# Patient Record
Sex: Female | Born: 1975
Health system: Southern US, Community
[De-identification: ages and names within clinical notes are randomized; demographics above are authoritative.]

## PROBLEM LIST (undated history)

## (undated) DIAGNOSIS — R519 Headache, unspecified: Secondary | ICD-10-CM

## (undated) DIAGNOSIS — D649 Anemia, unspecified: Secondary | ICD-10-CM

## (undated) DIAGNOSIS — R011 Cardiac murmur, unspecified: Secondary | ICD-10-CM

## (undated) DIAGNOSIS — R51 Headache: Secondary | ICD-10-CM

## (undated) HISTORY — PX: MYOMECTOMY: SHX85

## (undated) HISTORY — PX: TUMOR REMOVAL: SHX12

## (undated) HISTORY — PX: FRACTURE SURGERY: SHX138

## (undated) HISTORY — PX: THYROID LOBECTOMY: SHX420

---

## 2003-10-03 ENCOUNTER — Other Ambulatory Visit: Admission: RE | Admit: 2003-10-03 | Discharge: 2003-10-03 | Payer: Self-pay | Admitting: Obstetrics and Gynecology

## 2017-10-12 DIAGNOSIS — E041 Nontoxic single thyroid nodule: Secondary | ICD-10-CM | POA: Diagnosis not present

## 2017-10-12 DIAGNOSIS — R197 Diarrhea, unspecified: Secondary | ICD-10-CM | POA: Diagnosis not present

## 2017-10-12 DIAGNOSIS — R109 Unspecified abdominal pain: Secondary | ICD-10-CM | POA: Diagnosis not present

## 2017-10-19 ENCOUNTER — Encounter (HOSPITAL_COMMUNITY): Payer: Self-pay | Admitting: Family Medicine

## 2017-10-19 ENCOUNTER — Other Ambulatory Visit: Payer: Self-pay | Admitting: Internal Medicine

## 2017-10-19 ENCOUNTER — Emergency Department (HOSPITAL_COMMUNITY)
Admission: EM | Admit: 2017-10-19 | Discharge: 2017-10-19 | Disposition: A | Discharge status: Home / Self Care | Payer: BLUE CROSS/BLUE SHIELD | Attending: Emergency Medicine | Admitting: Emergency Medicine

## 2017-10-19 DIAGNOSIS — M79644 Pain in right finger(s): Secondary | ICD-10-CM | POA: Diagnosis not present

## 2017-10-19 DIAGNOSIS — R202 Paresthesia of skin: Secondary | ICD-10-CM | POA: Diagnosis not present

## 2017-10-19 DIAGNOSIS — E041 Nontoxic single thyroid nodule: Secondary | ICD-10-CM

## 2017-10-19 DIAGNOSIS — R200 Anesthesia of skin: Secondary | ICD-10-CM | POA: Insufficient documentation

## 2017-10-19 DIAGNOSIS — L723 Sebaceous cyst: Secondary | ICD-10-CM | POA: Diagnosis not present

## 2017-10-19 DIAGNOSIS — D367 Benign neoplasm of other specified sites: Secondary | ICD-10-CM | POA: Diagnosis not present

## 2017-10-19 DIAGNOSIS — M79631 Pain in right forearm: Secondary | ICD-10-CM | POA: Diagnosis not present

## 2017-10-19 NOTE — ED Triage Notes (Signed)
Patient reports on Friday morning she noticed a small tender area on her right forearm. As the weekend as progressed, it has increased in tenderness, harder, more defend area, and pain with numbness in the fingers. Patient denies taking any thing for the symptoms.

## 2017-10-19 NOTE — ED Provider Notes (Signed)
[facility] DEPT Provider Note   CSN: [id number] Arrival date & time: 10/19/17  1721     History   Chief Complaint Chief Complaint  Patient presents with  . Cyst    HPI Ann Olson is a 41 y.o. female.  HPI   41 year old female presenting for evaluation of discomfort in her right forearm.  Patient reports 4 days ago she woke up noticing an area of discomfort to right proximal forearm.  But it has since increasing in size and become more painful.  Pain is described as sharp achy sensation with tingling numbness radiates down to her arm and hand.  She denies any specific treatment tried.  She denies any specific injury.  No fever or chills.  No prior history of abscess.  She does have history of having benign tumors that appears in the hip, abdomen, and lower jaw requiring surgical excision in the past.  She also mention having prior blood clot in the past after surgery.    History reviewed. No pertinent past medical history.  There are no active problems to display for this patient.   Past Surgical History:  Procedure Laterality Date  . MYOMECTOMY    . TUMOR REMOVAL     Left Hip  . TUMOR REMOVAL     Face, Lower Jaw     OB History    No data available       Home Medications    Prior to Admission medications   Not on File    Family History History reviewed. No pertinent family history.  Social History Social History   Tobacco Use  . Smoking status: Never Smoker  . Smokeless tobacco: Never Used  Substance Use Topics  . Alcohol use: Yes    Comment: Last drink: September   . Drug use: No     Allergies   Patient has no allergy information on record.   Review of Systems Review of Systems  Constitutional: Negative for fever.  Respiratory: Negative for shortness of breath.   Cardiovascular: Negative for chest pain.  Skin: Negative for rash and wound.  Neurological: Positive for numbness.     Physical  Exam Updated Vital Signs BP (!) 126/92 (BP Location: Left Arm)   Pulse 72   Temp 98.3 F (36.8 C) (Oral)   Resp 18   Ht 5\' 8"  (1.727 m)   Wt 77.1 kg (170 lb)   LMP 10/02/2017   SpO2 100%   BMI 25.85 kg/m   Physical Exam  Constitutional: She appears well-developed and well-nourished. No distress.  HENT:  Head: Atraumatic.  Eyes: Conjunctivae are normal.  Neck: Neck supple.  Neurological: She is alert.  Skin: No rash noted.  R forearm: a subcutaneous nodule measuring approximately 2cm in diameter noted to proximal volar aspect of forearm, tender to palpation but no overlying skin changes.  Radial pulse 2+, sensation intact throughout all fingers with normal grip strength.  No elbow involvement.    Psychiatric: She has a normal mood and affect.  Nursing note and vitals reviewed.    ED Treatments / Results  Labs (all labs ordered are listed, but only abnormal results are displayed) Labs Reviewed - No data to display  EKG  EKG Interpretation None       Radiology No results found.   Procedures Procedures (including critical care time)  Medications Ordered in ED Medications - No data to display   Initial Impression / Assessment and Plan / ED Course  I have  reviewed the triage vital signs and the nursing notes.  Pertinent labs & imaging results that were available during my care of the patient were reviewed by me and considered in my medical decision making (see chart for details).     BP (!) 126/92 (BP Location: Left Arm)   Pulse 72   Temp 98.3 F (36.8 C) (Oral)   Resp 18   Ht 5\' 8"  (1.727 m)   Wt 77.1 kg (170 lb)   LMP 10/02/2017   SpO2 100%   BMI 25.85 kg/m    Final Clinical Impressions(s) / ED Diagnoses   Final diagnoses:  Dermoid cyst of arm, right    ED Discharge Orders    None     7:28 PM Patient here with tenderness to her right proximal forearm.  She has a subcutaneous cystic lesion in that show some heterogeneous changes on bedside  ultrasound.  Finding is not consistent with an abscess.  She does have history of benign tumors requiring surgical removal in the past, I suspect this is likely related to that.  No evidence of cellulitis.  I have low suspicion for DVT.  I also discussed this with Dr. Tomi Bamberger and we both have evaluated patient and agrees that patient will be best managed by outpatient surgical specialist.  Patient will be referred to [facility] surgery for further care.  Return precautions discussed.   Domenic Moras, PA-C 10/19/17 1933    Dorie Rank, MD 10/20/17 [phone]

## 2017-10-19 NOTE — Discharge Instructions (Signed)
You have been evaluated for your condition.  It appears you have a cystic structure on your right forearm.  Please follow up with [facility] Surgery for further management.  Take tylenol or ibuprofen as needed for pain.

## 2017-10-19 NOTE — ED Notes (Signed)
ED Provider at bedside. 

## 2017-10-27 ENCOUNTER — Other Ambulatory Visit: Payer: Self-pay

## 2017-10-27 ENCOUNTER — Ambulatory Visit
Admission: EM | Admit: 2017-10-27 | Discharge: 2017-10-27 | Disposition: A | Discharge status: Home / Self Care | Payer: No Typology Code available for payment source | Attending: Emergency Medicine | Admitting: Emergency Medicine

## 2017-10-27 ENCOUNTER — Emergency Department
Admission: EM | Admit: 2017-10-27 | Discharge: 2017-10-27 | Disposition: A | Discharge status: Home / Self Care | Payer: BLUE CROSS/BLUE SHIELD | Attending: Emergency Medicine | Admitting: Emergency Medicine

## 2017-10-27 ENCOUNTER — Encounter: Payer: Self-pay | Admitting: Emergency Medicine

## 2017-10-27 DIAGNOSIS — Z0441 Encounter for examination and observation following alleged adult rape: Secondary | ICD-10-CM | POA: Insufficient documentation

## 2017-10-27 DIAGNOSIS — T7421XA Adult sexual abuse, confirmed, initial encounter: Secondary | ICD-10-CM | POA: Diagnosis not present

## 2017-10-27 DIAGNOSIS — R102 Pelvic and perineal pain: Secondary | ICD-10-CM | POA: Diagnosis not present

## 2017-10-27 DIAGNOSIS — Z880 Allergy status to penicillin: Secondary | ICD-10-CM | POA: Diagnosis not present

## 2017-10-27 LAB — COMPREHENSIVE METABOLIC PANEL
ALT: 12 U/L — ABNORMAL LOW (ref 14–54)
AST: 24 U/L (ref 15–41)
Albumin: 4.3 g/dL (ref 3.5–5.0)
Alkaline Phosphatase: 68 U/L (ref 38–126)
Anion gap: 9 (ref 5–15)
BUN: 10 mg/dL (ref 6–20)
CO2: 22 mmol/L (ref 22–32)
Calcium: 9.4 mg/dL (ref 8.9–10.3)
Chloride: 105 mmol/L (ref 101–111)
Creatinine, Ser: 0.77 mg/dL (ref 0.44–1.00)
GFR calc Af Amer: >60 mL/min (ref >60)
GFR calc non Af Amer: >60 mL/min (ref >60)
Glucose, Bld: 98 mg/dL (ref 65–99)
Potassium: 3.5 mmol/L (ref 3.5–5.1)
Sodium: 136 mmol/L (ref 135–145)
Total Bilirubin: 0.8 mg/dL (ref 0.3–1.2)
Total Protein: 8.1 g/dL (ref 6.5–8.1)

## 2017-10-27 LAB — RAPID HIV SCREEN (HIV 1/2 AB+AG)
HIV 1/2 Antibodies: NONREACTIVE
HIV-1 P24 Antigen - HIV24: NONREACTIVE

## 2017-10-27 MED ORDER — LIDOCAINE HCL (PF) 1 % IJ SOLN
INTRAMUSCULAR | Status: AC
  Administered 2017-10-27: 5 mL
  Filled 2017-10-27: qty 5

## 2017-10-27 MED ORDER — CEFTRIAXONE SODIUM 250 MG IJ SOLR
INTRAMUSCULAR | Status: AC
  Filled 2017-10-27: qty 250

## 2017-10-27 MED ORDER — AZITHROMYCIN 500 MG PO TABS
1000.0000 mg | ORAL_TABLET | Freq: Once | ORAL | Status: AC
  Administered 2017-10-27: 1000 mg via ORAL

## 2017-10-27 MED ORDER — ULIPRISTAL ACETATE 30 MG PO TABS
ORAL_TABLET | ORAL | Status: AC
  Filled 2017-10-27: qty 1

## 2017-10-27 MED ORDER — LIDOCAINE HCL (PF) 1 % IJ SOLN: 0.9000 mL | Freq: Once | INTRAMUSCULAR | Status: DC

## 2017-10-27 MED ORDER — OXYCODONE-ACETAMINOPHEN 5-325 MG PO TABS: 1.0000 | ORAL_TABLET | Freq: Four times a day (QID) | ORAL | 0 refills | Status: DC | PRN

## 2017-10-27 MED ORDER — CLINDAMYCIN HCL 300 MG PO CAPS: 300.0000 mg | ORAL_CAPSULE | Freq: Three times a day (TID) | ORAL | 0 refills | Status: DC

## 2017-10-27 MED ORDER — ELVITEG-COBIC-EMTRICIT-TENOFAF 150-150-200-10 MG PO TABS: 1.0000 | ORAL_TABLET | Freq: Every day | ORAL | 0 refills | Status: AC

## 2017-10-27 MED ORDER — CEPHALEXIN 500 MG PO CAPS: 500.0000 mg | ORAL_CAPSULE | Freq: Three times a day (TID) | ORAL | 0 refills | Status: DC

## 2017-10-27 MED ORDER — AZITHROMYCIN 500 MG PO TABS
ORAL_TABLET | ORAL | Status: AC
  Filled 2017-10-27: qty 2

## 2017-10-27 MED ORDER — CEFTRIAXONE SODIUM 250 MG IJ SOLR
250.0000 mg | Freq: Once | INTRAMUSCULAR | Status: AC
  Administered 2017-10-27: 250 mg via INTRAMUSCULAR

## 2017-10-27 MED ORDER — ULIPRISTAL ACETATE 30 MG PO TABS
30.0000 mg | ORAL_TABLET | Freq: Once | ORAL | Status: AC
  Administered 2017-10-27: 30 mg via ORAL

## 2017-10-27 MED ORDER — METRONIDAZOLE 500 MG PO TABS
2000.0000 mg | ORAL_TABLET | Freq: Once | ORAL | Status: AC
  Administered 2017-10-27: 2000 mg via ORAL

## 2017-10-27 NOTE — ED Notes (Signed)
Patient is with RNs in the SANE room.
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
## 2017-10-27 NOTE — SANE Note (Addendum)
   Date - 10/27/2017 Patient Name - Ann Olson Patient MRN - [id number] Patient DOB - 11-08-1976 Patient Gender - female  STEP 18 - EVIDENCE CHECKLIST AND DISPOSITION OF EVIDENCE  I. EVIDENCE COLLECTION   Follow the instructions found in the [place] Collection Kit.  Clearly identify, date, initial and seal all containers.  Check off items that are collected:   A. Unknown Samples    Collected? 1. Outer Clothing NO  2. Underpants - Panties NO -  Pt has them but not with her  3. Oral Smears and Swabs YES  4. Pubic Hair Combings NO  5. Vaginal Smears and Swabs YES  6. Rectal Smears and Swabs  YES  7. Toxicology Samples NO  Note: Collect smears and swabs only from body cavities which were  penetrated.    B. Known Samples: Collect in every case  Collected? 1. Pulled Pubic Hair Sample  NO - Pt declined  2. Pulled Head Hair Sample NO - Pt declined  3. Known Blood Sample NO - Additional oral swabs collected  4. Known Cheek Scraping  YES         C. Photographs    Add Text  1. By Whom   NA  2. Describe photographs Pt declined  3. Photo given to  NA         II.  DISPOSITION OF EVIDENCE    A. Law Enforcement:  Add Text 1. [place]  2. Officer See chain of custody                [place]:   Add Text   1. Officer NA     C. Chain of Custody: See outside of box.

## 2017-10-27 NOTE — SANE Note (Signed)
Received call from Mateo Flow, Kenidee Cregan at [facility].  She gave report on pt.  Pt has not been evaluated by MD as of yet.  Advised to have RN call back once pt has been medically cleared - she agrees.

## 2017-10-27 NOTE — ED Notes (Signed)
SANE RN at bedside.

## 2017-10-27 NOTE — ED Notes (Signed)
Patient remains fully dressed, is tearful and was given tissues.
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
## 2017-10-28 LAB — HCG, QUANTITATIVE, PREGNANCY: hCG, Beta Chain, Quant, S: <1 m[IU]/mL (ref <5)
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
## 2017-10-28 NOTE — SANE Note (Signed)
10/28/17 1000: I spoke with [facility] Personnel about this patient who started HIV nPEP.                          I was informed that it is possible that patient does have insurance and they would do a "rush investigation" on this and call back. 10/28/17 1424: I received a fax from [employer] reporting patient's insurance status.                          I spoke with Doroteo Bradford confirming fax information and that the name of insurance company was [employer] [phone]). 10/28/17 1517: Email sent to case management for assistance in following up.

## 2017-10-28 NOTE — SANE Note (Signed)
Follow-up Phone Call  Patient gives verbal consent for a FNE/SANE follow-up phone call in 48-72 hours: yes Patient's telephone number: [phone] Patient gives verbal consent to leave voicemail at the phone number listed above: yes DO NOT CALL between the hours of: NA

## 2017-10-28 NOTE — SANE Note (Signed)
10/28/17 1710: Message left for patient to return call.

## 2017-10-29 LAB — RPR: RPR Ser Ql: NONREACTIVE

## 2017-10-29 LAB — HEPATITIS C ANTIBODY: HCV Ab: 0.5 s/co ratio (ref 0.0–0.9)

## 2017-10-29 LAB — HEPATITIS B SURFACE ANTIGEN: Hepatitis B Surface Ag: NEGATIVE

## 2017-10-29 MED FILL — GENVOYA TABLET: 150-150-200 | 23 days supply | Qty: 23 | Fill #0

## 2017-10-29 NOTE — Care Management (Signed)
  10/28/17 @ 20:00- Talked with [facility] in an attempt to get [name] voucher assistance. [facility] reports that patient is covered by [employer] insurance as follows:   Policy # [id number] Group # [id number] [facility] # [id number] Policy # no number listed  At this time, it is too late to call [employer] to request assistance with $30.00 [employer]. They have closed for the day. Attempted to contact patient to provide insurance information and inquire if she would like to apply for [employer] assistance. VM left requesting she call us back. Suggest that patient is called tomorrow to complete assistance request online or that Ann Olson is called tomorrow by FNE at [phone] to complete assistance over the phone. Will email [facility] pharmacy with insurance information.

## 2017-10-29 NOTE — SANE Note (Addendum)
10/29/17 0930 Telephone call to [employer] for copay assistance. Information provided:  ID#: [id number] BIN#: [id number] PCN#: [id number]#: [id number]  Above information emailed to [facility].

## 2017-12-03 DIAGNOSIS — L709 Acne, unspecified: Secondary | ICD-10-CM | POA: Diagnosis not present

## 2017-12-03 DIAGNOSIS — E041 Nontoxic single thyroid nodule: Secondary | ICD-10-CM | POA: Diagnosis not present

## 2017-12-03 DIAGNOSIS — L659 Nonscarring hair loss, unspecified: Secondary | ICD-10-CM | POA: Diagnosis not present

## 2017-12-14 ENCOUNTER — Ambulatory Visit
Admission: RE | Admit: 2017-12-14 | Discharge: 2017-12-14 | Disposition: A | Discharge status: Home / Self Care | Payer: BLUE CROSS/BLUE SHIELD | Source: Ambulatory Visit | Attending: Internal Medicine | Admitting: Internal Medicine

## 2017-12-14 DIAGNOSIS — E041 Nontoxic single thyroid nodule: Secondary | ICD-10-CM

## 2017-12-21 ENCOUNTER — Other Ambulatory Visit: Payer: Self-pay | Admitting: Internal Medicine

## 2017-12-21 DIAGNOSIS — E041 Nontoxic single thyroid nodule: Secondary | ICD-10-CM

## 2017-12-23 ENCOUNTER — Ambulatory Visit
Admission: RE | Admit: 2017-12-23 | Discharge: 2017-12-23 | Disposition: A | Discharge status: Home / Self Care | Payer: BLUE CROSS/BLUE SHIELD | Source: Ambulatory Visit | Attending: Internal Medicine | Admitting: Internal Medicine

## 2017-12-23 ENCOUNTER — Other Ambulatory Visit (HOSPITAL_COMMUNITY)
Admission: RE | Admit: 2017-12-23 | Discharge: 2017-12-23 | Disposition: A | Discharge status: Home / Self Care | Payer: BLUE CROSS/BLUE SHIELD | Source: Ambulatory Visit | Attending: Physician Assistant | Admitting: Physician Assistant

## 2017-12-23 DIAGNOSIS — E041 Nontoxic single thyroid nodule: Secondary | ICD-10-CM | POA: Insufficient documentation

## 2017-12-23 DIAGNOSIS — E079 Disorder of thyroid, unspecified: Secondary | ICD-10-CM | POA: Diagnosis not present

## 2017-12-23 NOTE — Procedures (Signed)
PROCEDURE SUMMARY:  Using direct ultrasound guidance, 5 passes were made using 25 g needles into the nodule within the right lobe of the thyroid.   Ultrasound was used to confirm needle placements on all occasions.   Specimens were sent to Pathology for analysis.  See procedure note under Imaging tab in Epic for full procedure details.  Safaa Stingley S Rayen Palen PA-C 12/23/2017 8:54 AM

## 2017-12-30 DIAGNOSIS — E041 Nontoxic single thyroid nodule: Secondary | ICD-10-CM | POA: Diagnosis not present

## 2018-01-12 ENCOUNTER — Encounter (HOSPITAL_COMMUNITY): Payer: Self-pay

## 2018-03-05 ENCOUNTER — Ambulatory Visit: Payer: Self-pay | Admitting: Surgery

## 2018-03-05 DIAGNOSIS — D440 Neoplasm of uncertain behavior of thyroid gland: Secondary | ICD-10-CM | POA: Diagnosis not present

## 2018-03-18 NOTE — Patient Instructions (Addendum)
Ann Olson  03/18/2018   Your procedure is scheduled on: 03-25-18  Report to [facility] Main  Entrance                 Report to admitting at        0830 AM    Call this number if you have problems the morning of surgery [phone]    Remember: Do not eat food or drink liquids :After Midnight.     Take these medicines the morning of surgery with A SIP OF WATER: none                                You may not have any metal on your body including hair pins and              piercings  Do not wear jewelry, make-up, lotions, powders or perfumes, deodorant             Do not wear nail polish.  Do not shave  48 hours prior to surgery.           Do not bring valuables to the hospital. [place].  Contacts, dentures or bridgework may not be worn into surgery.  Leave suitcase in the car. After surgery it may be brought to your room.                Please read over the following fact sheets you were given: _____________________________________________________________________           [facility] - Preparing for Surgery Before surgery, you can play an important role.  Because skin is not sterile, your skin needs to be as free of germs as possible.  You can reduce the number of germs on your skin by washing with CHG (chlorahexidine gluconate) soap before surgery.  CHG is an antiseptic cleaner which kills germs and bonds with the skin to continue killing germs even after washing. Please DO NOT use if you have an allergy to CHG or antibacterial soaps.  If your skin becomes reddened/irritated stop using the CHG and inform your nurse when you arrive at Short Stay. Do not shave (including legs and underarms) for at least 48 hours prior to the first CHG shower.  You may shave your face/neck. Please follow these instructions carefully:  1.  Shower with CHG Soap the night before surgery and the  morning of  Surgery.  2.  If you choose to wash your hair, wash your hair first as usual with your  normal  shampoo.  3.  After you shampoo, rinse your hair and body thoroughly to remove the  shampoo.                           4.  Use CHG as you would any other liquid soap.  You can apply chg directly  to the skin and wash                       Gently with a scrungie or clean washcloth.  5.  Apply the CHG Soap to your body ONLY FROM THE NECK DOWN.   Do not use on face/ open  Wound or open sores. Avoid contact with eyes, ears mouth and genitals (private parts).                       Wash face,  Genitals (private parts) with your normal soap.             6.  Wash thoroughly, paying special attention to the area where your surgery  will be performed.  7.  Thoroughly rinse your body with warm water from the neck down.  8.  DO NOT shower/wash with your normal soap after using and rinsing off  the CHG Soap.                9.  Pat yourself dry with a clean towel.            10.  Wear clean pajamas.            11.  Place clean sheets on your bed the night of your first shower and do not  sleep with pets. Day of Surgery : Do not apply any lotions/deodorants the morning of surgery.  Please wear clean clothes to the hospital/surgery center.  FAILURE TO FOLLOW THESE INSTRUCTIONS MAY RESULT IN THE CANCELLATION OF YOUR SURGERY PATIENT SIGNATURE_________________________________  NURSE SIGNATURE__________________________________  ________________________________________________________________________

## 2018-03-20 ENCOUNTER — Encounter (HOSPITAL_COMMUNITY): Payer: Self-pay | Admitting: Surgery

## 2018-03-20 DIAGNOSIS — D440 Neoplasm of uncertain behavior of thyroid gland: Secondary | ICD-10-CM | POA: Diagnosis present

## 2018-03-20 NOTE — H&P (Signed)
General Surgery [facility]  Ann Olson DOB: [birth year] Single / Language: Cleophus Molt / Race: Undefined Female  History of Present Illness  The patient is a 42 year old female who presents with a thyroid nodule.  CC: thyroid neoplasm of uncertain behavior  Patient is referred by Dr. Dagmar Hait for surgical evaluation and management of right thyroid nodule with cytologic atypia. Patient was originally diagnosed with a right-sided thyroid nodule 2 years ago in [place], [place]. She underwent fine-needle aspiration biopsy and was told that she did not require surgery but should continue to have follow-up. Patient has relocated to [place]. She has been evaluated by Dr. Buddy Duty. This included an ultrasound examination which showed a 2.3 cm nodule in the inferior right thyroid lobe. Thyroid gland is otherwise normal in size without any other significant nodules. Patient underwent fine-needle aspiration biopsies which showed cytologic atypia of undetermined significance, Bethesda category III. The sample was sent for molecular genetic testing which revealed a suspicious result, making her risk of malignancy approximately 50%. Patient has had no prior head or neck surgery. She has never been on thyroid medication. There is no significant family history of thyroid disease or other endocrine neoplasm. Patient denies any compressive symptoms. She presents today to discuss right thyroid lobectomy for definitive diagnosis and management.   Diagnostic Studies History Mammogram  >3 years ago Pap Smear  1-5 years ago  Allergies  Penicillins  Anaphylaxis. Allergies Reconciled   Medication History  No Current Medications Medications Reconciled  Social History Alcohol use  Occasional alcohol use. Caffeine use  Carbonated beverages, Coffee, Tea. No drug use  Tobacco use  Never smoker.  Family History Alcohol Abuse  Father. Arthritis   Mother. Cerebrovascular Accident  Father. Hypertension  Father, Mother.  Pregnancy / Birth History  Age at menarche  82 years. Contraceptive History  Oral contraceptives. Gravida  1 Maternal age  13-35 Para  0 Regular periods   Other Problems Heart murmur  Migraine Headache  Thyroid Disease   Review of Systems  General Not Present- Appetite Loss, Chills, Fatigue, Fever, Night Sweats, Weight Gain and Weight Loss. Skin Not Present- Change in Wart/Mole, Dryness, Hives, Jaundice, New Lesions, Non-Healing Wounds, Rash and Ulcer. HEENT Not Present- Earache, Hearing Loss, Hoarseness, Nose Bleed, Oral Ulcers, Ringing in the Ears, Seasonal Allergies, Sinus Pain, Sore Throat, Visual Disturbances, Wears glasses/contact lenses and Yellow Eyes. Respiratory Not Present- Bloody sputum, Chronic Cough, Difficulty Breathing, Snoring and Wheezing. Breast Not Present- Breast Mass, Breast Pain, Nipple Discharge and Skin Changes. Cardiovascular Not Present- Chest Pain, Difficulty Breathing Lying Down, Leg Cramps, Palpitations, Rapid Heart Rate, Shortness of Breath and Swelling of Extremities. Gastrointestinal Not Present- Abdominal Pain, Bloating, Bloody Stool, Change in Bowel Habits, Chronic diarrhea, Constipation, Difficulty Swallowing, Excessive gas, Gets full quickly at meals, Hemorrhoids, Indigestion, Nausea, Rectal Pain and Vomiting. Female Genitourinary Not Present- Frequency, Nocturia, Painful Urination, Pelvic Pain and Urgency. Musculoskeletal Not Present- Back Pain, Joint Pain, Joint Stiffness, Muscle Pain, Muscle Weakness and Swelling of Extremities. Neurological Not Present- Decreased Memory, Fainting, Headaches, Numbness, Seizures, Tingling, Tremor, Trouble walking and Weakness. Psychiatric Not Present- Anxiety, Bipolar, Change in Sleep Pattern, Depression, Fearful and Frequent crying. Endocrine Not Present- Cold Intolerance, Excessive Hunger, Hair Changes, Heat Intolerance, Hot  flashes and New Diabetes. Hematology Not Present- Blood Thinners, Easy Bruising, Excessive bleeding, Gland problems, HIV and Persistent Infections.  Vitals Weight: 167.2 lb Height: 68in Body Surface Area: 1.89 m Body Mass Index: 25.42 kg/m  Temp.: 97.18F(Temporal)  Pulse:  99 (Regular)  BP: 122/78 (Sitting, Left Arm, Standard)  Physical Exam  See vital signs recorded above  GENERAL APPEARANCE Development: normal Nutritional status: normal Gross deformities: none  SKIN Rash, lesions, ulcers: none Induration, erythema: none Nodules: none palpable  EYES Conjunctiva and lids: normal Pupils: equal and reactive Iris: normal bilaterally  EARS, NOSE, MOUTH, THROAT External ears: no lesion or deformity External nose: no lesion or deformity Hearing: grossly normal Lips: no lesion or deformity Dentition: normal for age Oral mucosa: moist  NECK Symmetric: no Trachea: midline Thyroid: There is a visible nodule in the mid-to lower portion of the right thyroid lobe. On palpation this measures approximately 2 cm in size, smooth, is mobile, is nontender, and is slightly firm. No other nodularity is palpable in the right or left thyroid bed. There is no significant lymphadenopathy. Voice quality is normal.  CHEST Respiratory effort: normal Retraction or accessory muscle use: no Breath sounds: normal bilaterally Rales, rhonchi, wheeze: none  CARDIOVASCULAR Auscultation: regular rhythm, normal rate Murmurs: none Pulses: carotid and radial pulse 2+ palpable Lower extremity edema: none Lower extremity varicosities: none  MUSCULOSKELETAL Station and gait: normal Digits and nails: no clubbing or cyanosis Muscle strength: grossly normal all extremities Range of motion: grossly normal all extremities Deformity: none  LYMPHATIC Cervical: none palpable Supraclavicular: none palpable  PSYCHIATRIC Oriented to person, place, and time: yes Mood and affect: normal for  situation Judgment and insight: appropriate for situation    Assessment & Plan  NEOPLASM OF UNCERTAIN BEHAVIOR OF THYROID GLAND (D44.0)  Pt Education - Pamphlet Given - The Thyroid Book: discussed with patient and provided information.  Patient presents with a right-sided thyroid nodule measuring 2.3 cm with cytologic atypia. Molecular genetic testing indicates a suspicious lesion with a risk of malignancy of 50%. Patient is provided with written literature on thyroid surgery to review at home.  After discussion, I have recommended proceeding with right thyroid lobectomy for definitive diagnosis and management. We have discussed the risk and benefits of the procedure. We have discussed the risk of recurrent laryngeal nerve injury and injury to parathyroid glands. We have discussed the hospital stay to be anticipated. We have discussed the location and size of the surgical incision. We have discussed her postoperative recovery and return to work and activities. We have discussed the need for lifelong thyroid hormone replacement. We have discussed the possible need for further surgery. Patient understands and wishes to proceed with surgery in the near future. We will make arrangements at a time convenient for the patient.  The risks and benefits of the procedure have been discussed at length with the patient. The patient understands the proposed procedure, potential alternative treatments, and the course of recovery to be expected. All of the patient's questions have been answered at this time. The patient wishes to proceed with surgery.  Armandina Gemma, [facility] Office: [phone]

## 2018-03-24 ENCOUNTER — Other Ambulatory Visit: Payer: Self-pay

## 2018-03-24 ENCOUNTER — Ambulatory Visit (HOSPITAL_COMMUNITY)
Admission: RE | Admit: 2018-03-24 | Discharge: 2018-03-24 | Disposition: A | Discharge status: Home / Self Care | Payer: BLUE CROSS/BLUE SHIELD | Source: Ambulatory Visit | Attending: Anesthesiology | Admitting: Anesthesiology

## 2018-03-24 ENCOUNTER — Encounter (INDEPENDENT_AMBULATORY_CARE_PROVIDER_SITE_OTHER): Payer: Self-pay

## 2018-03-24 ENCOUNTER — Encounter (HOSPITAL_COMMUNITY)
Admission: RE | Admit: 2018-03-24 | Discharge: 2018-03-24 | Disposition: A | Discharge status: Home / Self Care | Payer: BLUE CROSS/BLUE SHIELD | Source: Ambulatory Visit | Attending: Surgery | Admitting: Surgery

## 2018-03-24 ENCOUNTER — Encounter (HOSPITAL_COMMUNITY): Payer: Self-pay

## 2018-03-24 DIAGNOSIS — Z01818 Encounter for other preprocedural examination: Secondary | ICD-10-CM | POA: Insufficient documentation

## 2018-03-24 DIAGNOSIS — D497 Neoplasm of unspecified behavior of endocrine glands and other parts of nervous system: Secondary | ICD-10-CM | POA: Insufficient documentation

## 2018-03-24 DIAGNOSIS — Z01812 Encounter for preprocedural laboratory examination: Secondary | ICD-10-CM | POA: Diagnosis not present

## 2018-03-24 LAB — CBC
HCT: 33.1 % — ABNORMAL LOW (ref 36.0–46.0)
Hemoglobin: 10.1 g/dL — ABNORMAL LOW (ref 12.0–15.0)
MCH: 23.3 pg — ABNORMAL LOW (ref 26.0–34.0)
MCHC: 30.5 g/dL (ref 30.0–36.0)
MCV: 76.4 fL — ABNORMAL LOW (ref 78.0–100.0)
Platelets: 456 10*3/uL — ABNORMAL HIGH (ref 150–400)
RBC: 4.33 MIL/uL (ref 3.87–5.11)
RDW: 19.7 % — ABNORMAL HIGH (ref 11.5–15.5)
WBC: 4.1 10*3/uL (ref 4.0–10.5)

## 2018-03-24 LAB — HCG, SERUM, QUALITATIVE: Preg, Serum: NEGATIVE

## 2018-03-24 NOTE — Progress Notes (Signed)
CBC done 03-24-18 routed to Dr. Harlow Asa via epic.

## 2018-03-25 ENCOUNTER — Ambulatory Visit (HOSPITAL_COMMUNITY): Payer: BLUE CROSS/BLUE SHIELD | Admitting: Certified Registered"

## 2018-03-25 ENCOUNTER — Observation Stay (HOSPITAL_COMMUNITY)
Admission: RE | Admit: 2018-03-25 | Discharge: 2018-03-26 | Disposition: A | Discharge status: Home / Self Care | Payer: BLUE CROSS/BLUE SHIELD | Source: Ambulatory Visit | Attending: Surgery | Admitting: Surgery

## 2018-03-25 ENCOUNTER — Encounter (HOSPITAL_COMMUNITY)
Admission: RE | Disposition: A | Discharge status: Home / Self Care | Payer: Self-pay | Source: Ambulatory Visit | Attending: Surgery

## 2018-03-25 ENCOUNTER — Encounter (HOSPITAL_COMMUNITY): Payer: Self-pay

## 2018-03-25 DIAGNOSIS — D440 Neoplasm of uncertain behavior of thyroid gland: Principal | ICD-10-CM | POA: Insufficient documentation

## 2018-03-25 DIAGNOSIS — D649 Anemia, unspecified: Secondary | ICD-10-CM | POA: Diagnosis not present

## 2018-03-25 DIAGNOSIS — D497 Neoplasm of unspecified behavior of endocrine glands and other parts of nervous system: Secondary | ICD-10-CM | POA: Diagnosis not present

## 2018-03-25 DIAGNOSIS — D34 Benign neoplasm of thyroid gland: Secondary | ICD-10-CM | POA: Diagnosis not present

## 2018-03-25 DIAGNOSIS — E041 Nontoxic single thyroid nodule: Secondary | ICD-10-CM | POA: Diagnosis not present

## 2018-03-25 HISTORY — PX: THYROID LOBECTOMY: SHX420

## 2018-03-25 HISTORY — DX: Anemia, unspecified: D64.9

## 2018-03-25 HISTORY — DX: Headache: R51

## 2018-03-25 HISTORY — DX: Cardiac murmur, unspecified: R01.1

## 2018-03-25 HISTORY — DX: Headache, unspecified: R51.9

## 2018-03-25 SURGERY — LOBECTOMY, THYROID
Anesthesia: General | Site: Neck | Laterality: Right

## 2018-03-25 MED ORDER — TRAMADOL HCL 50 MG PO TABS
50.0000 mg | ORAL_TABLET | Freq: Four times a day (QID) | ORAL | Status: DC | PRN
  Administered 2018-03-25: 50 mg via ORAL
  Filled 2018-03-25: qty 1

## 2018-03-25 MED ORDER — ONDANSETRON HCL 4 MG/2ML IJ SOLN
INTRAMUSCULAR | Status: DC | PRN
  Administered 2018-03-25: 4 mg via INTRAVENOUS

## 2018-03-25 MED ORDER — PROPOFOL 10 MG/ML IV BOLUS
INTRAVENOUS | Status: AC
  Filled 2018-03-25: qty 20

## 2018-03-25 MED ORDER — HYDROMORPHONE HCL 1 MG/ML IJ SOLN: 1.0000 mg | INTRAMUSCULAR | Status: DC | PRN

## 2018-03-25 MED ORDER — DEXAMETHASONE SODIUM PHOSPHATE 10 MG/ML IJ SOLN
INTRAMUSCULAR | Status: DC | PRN
Start: 2018-03-25 — End: 2018-03-25
  Administered 2018-03-25: 10 mg via INTRAVENOUS

## 2018-03-25 MED ORDER — HYDROMORPHONE HCL 1 MG/ML IJ SOLN
INTRAMUSCULAR | Status: AC
  Filled 2018-03-25: qty 1

## 2018-03-25 MED ORDER — ONDANSETRON HCL 4 MG/2ML IJ SOLN
4.0000 mg | Freq: Four times a day (QID) | INTRAMUSCULAR | Status: DC | PRN
  Administered 2018-03-25: 4 mg via INTRAVENOUS
  Filled 2018-03-25: qty 2

## 2018-03-25 MED ORDER — CIPROFLOXACIN IN D5W 400 MG/200ML IV SOLN
400.0000 mg | INTRAVENOUS | Status: AC
  Administered 2018-03-25: 400 mg via INTRAVENOUS
  Filled 2018-03-25: qty 200

## 2018-03-25 MED ORDER — CHLORHEXIDINE GLUCONATE CLOTH 2 % EX PADS: 6.0000 | MEDICATED_PAD | Freq: Once | CUTANEOUS | Status: DC

## 2018-03-25 MED ORDER — PROMETHAZINE HCL 25 MG/ML IJ SOLN: 6.2500 mg | INTRAMUSCULAR | Status: DC | PRN

## 2018-03-25 MED ORDER — FENTANYL CITRATE (PF) 250 MCG/5ML IJ SOLN
INTRAMUSCULAR | Status: AC
  Filled 2018-03-25: qty 5

## 2018-03-25 MED ORDER — OXYCODONE HCL 5 MG PO TABS: 5.0000 mg | ORAL_TABLET | Freq: Once | ORAL | Status: DC | PRN

## 2018-03-25 MED ORDER — ACETAMINOPHEN 325 MG PO TABS: 650.0000 mg | ORAL_TABLET | Freq: Four times a day (QID) | ORAL | Status: DC | PRN

## 2018-03-25 MED ORDER — SUGAMMADEX SODIUM 200 MG/2ML IV SOLN
INTRAVENOUS | Status: DC | PRN
  Administered 2018-03-25: 200 mg via INTRAVENOUS

## 2018-03-25 MED ORDER — OXYCODONE HCL 5 MG/5ML PO SOLN
5.0000 mg | Freq: Once | ORAL | Status: DC | PRN
  Filled 2018-03-25: qty 5

## 2018-03-25 MED ORDER — ACETAMINOPHEN 650 MG RE SUPP: 650.0000 mg | Freq: Four times a day (QID) | RECTAL | Status: DC | PRN

## 2018-03-25 MED ORDER — LACTATED RINGERS IV SOLN
INTRAVENOUS | Status: DC
  Administered 2018-03-25 (×2): via INTRAVENOUS

## 2018-03-25 MED ORDER — ONDANSETRON 4 MG PO TBDP: 4.0000 mg | ORAL_TABLET | Freq: Four times a day (QID) | ORAL | Status: DC | PRN

## 2018-03-25 MED ORDER — HYDROMORPHONE HCL 1 MG/ML IJ SOLN
0.2500 mg | INTRAMUSCULAR | Status: DC | PRN
  Administered 2018-03-25 (×3): 0.5 mg via INTRAVENOUS

## 2018-03-25 MED ORDER — 0.9 % SODIUM CHLORIDE (POUR BTL) OPTIME
TOPICAL | Status: DC | PRN
  Administered 2018-03-25: 1000 mL

## 2018-03-25 MED ORDER — LIDOCAINE 2% (20 MG/ML) 5 ML SYRINGE
INTRAMUSCULAR | Status: AC
  Filled 2018-03-25: qty 5

## 2018-03-25 MED ORDER — MIDAZOLAM HCL 2 MG/2ML IJ SOLN
INTRAMUSCULAR | Status: AC
  Filled 2018-03-25: qty 2

## 2018-03-25 MED ORDER — ONDANSETRON HCL 4 MG/2ML IJ SOLN
INTRAMUSCULAR | Status: AC
  Filled 2018-03-25: qty 2

## 2018-03-25 MED ORDER — LIDOCAINE 2% (20 MG/ML) 5 ML SYRINGE
INTRAMUSCULAR | Status: DC | PRN
  Administered 2018-03-25: 40 mg via INTRAVENOUS
  Administered 2018-03-25: 60 mg via INTRAVENOUS

## 2018-03-25 MED ORDER — HYDROCODONE-ACETAMINOPHEN 5-325 MG PO TABS
1.0000 | ORAL_TABLET | ORAL | Status: DC | PRN
  Administered 2018-03-25: 1 via ORAL
  Filled 2018-03-25: qty 1

## 2018-03-25 MED ORDER — KCL IN DEXTROSE-NACL 20-5-0.45 MEQ/L-%-% IV SOLN
INTRAVENOUS | Status: DC
  Administered 2018-03-25: 15:00:00 via INTRAVENOUS
  Filled 2018-03-25 (×2): qty 1000

## 2018-03-25 MED ORDER — ROCURONIUM BROMIDE 10 MG/ML (PF) SYRINGE
PREFILLED_SYRINGE | INTRAVENOUS | Status: DC | PRN
  Administered 2018-03-25: 40 mg via INTRAVENOUS

## 2018-03-25 MED ORDER — MIDAZOLAM HCL 2 MG/2ML IJ SOLN
INTRAMUSCULAR | Status: DC | PRN
  Administered 2018-03-25: 2 mg via INTRAVENOUS

## 2018-03-25 MED ORDER — DEXAMETHASONE SODIUM PHOSPHATE 10 MG/ML IJ SOLN
INTRAMUSCULAR | Status: AC
  Filled 2018-03-25: qty 1

## 2018-03-25 MED ORDER — FENTANYL CITRATE (PF) 250 MCG/5ML IJ SOLN
INTRAMUSCULAR | Status: DC | PRN
  Administered 2018-03-25: 50 ug via INTRAVENOUS
  Administered 2018-03-25: 100 ug via INTRAVENOUS

## 2018-03-25 MED ORDER — PROPOFOL 10 MG/ML IV BOLUS
INTRAVENOUS | Status: DC | PRN
  Administered 2018-03-25: 150 mg via INTRAVENOUS

## 2018-03-25 MED ORDER — ROCURONIUM BROMIDE 10 MG/ML (PF) SYRINGE
PREFILLED_SYRINGE | INTRAVENOUS | Status: AC
  Filled 2018-03-25: qty 5

## 2018-03-25 SURGICAL SUPPLY — 34 items
ATTRACTOMAT 16X20 MAGNETIC DRP (DRAPES) ×2 IMPLANT
BLADE SURG 15 STRL LF DISP TIS (BLADE) ×1 IMPLANT
BLADE SURG 15 STRL SS (BLADE) ×1
CHLORAPREP W/TINT 26ML (MISCELLANEOUS) ×4 IMPLANT
CLIP VESOCCLUDE MED 6/CT (CLIP) ×4 IMPLANT
CLIP VESOCCLUDE SM WIDE 6/CT (CLIP) ×4 IMPLANT
DISSECTOR ROUND CHERRY 3/8 STR (MISCELLANEOUS) IMPLANT
DRAPE LAPAROTOMY T 98X78 PEDS (DRAPES) ×2 IMPLANT
ELECT PENCIL ROCKER SW 15FT (MISCELLANEOUS) ×2 IMPLANT
ELECT REM PT RETURN 15FT ADLT (MISCELLANEOUS) ×2 IMPLANT
GAUZE 4X4 16PLY RFD (DISPOSABLE) ×2 IMPLANT
GAUZE SPONGE 4X4 12PLY STRL (GAUZE/BANDAGES/DRESSINGS) ×2 IMPLANT
GLOVE SURG ORTHO 8.0 STRL STRW (GLOVE) IMPLANT
GOWN STRL REUS W/TWL XL LVL3 (GOWN DISPOSABLE) ×4 IMPLANT
HEMOSTAT SURGICEL 2X4 FIBR (HEMOSTASIS) ×2 IMPLANT
ILLUMINATOR WAVEGUIDE N/F (MISCELLANEOUS) ×2 IMPLANT
KIT BASIN OR (CUSTOM PROCEDURE TRAY) ×2 IMPLANT
LIGHT WAVEGUIDE WIDE FLAT (MISCELLANEOUS) IMPLANT
PACK BASIC VI WITH GOWN DISP (CUSTOM PROCEDURE TRAY) ×2 IMPLANT
POWDER SURGICEL 3.0 GRAM (HEMOSTASIS) IMPLANT
SHEARS HARMONIC 9CM CVD (BLADE) ×2 IMPLANT
STAPLER VISISTAT 35W (STAPLE) ×2 IMPLANT
STRIP CLOSURE SKIN 1/2X4 (GAUZE/BANDAGES/DRESSINGS) ×2 IMPLANT
SUT MNCRL AB 4-0 PS2 18 (SUTURE) ×2 IMPLANT
SUT SILK 2 0 (SUTURE) ×1
SUT SILK 2-0 18XBRD TIE 12 (SUTURE) ×1 IMPLANT
SUT SILK 3 0 (SUTURE)
SUT SILK 3-0 18XBRD TIE 12 (SUTURE) IMPLANT
SUT VIC AB 3-0 SH 18 (SUTURE) ×2 IMPLANT
SYR BULB IRRIGATION 50ML (SYRINGE) ×2 IMPLANT
TAPE CLOTH SURG 4X10 WHT LF (GAUZE/BANDAGES/DRESSINGS) ×2 IMPLANT
TOWEL OR 17X26 10 PK STRL BLUE (TOWEL DISPOSABLE) ×2 IMPLANT
TOWEL OR NON WOVEN STRL DISP B (DISPOSABLE) ×2 IMPLANT
YANKAUER SUCT BULB TIP 10FT TU (MISCELLANEOUS) ×2 IMPLANT

## 2018-03-25 NOTE — Anesthesia Postprocedure Evaluation (Signed)
Anesthesia Post Note  Patient: Ann Olson  Procedure(s) Performed: RIGHT THYROID LOBECTOMY (Right Neck)     Patient location during evaluation: PACU Anesthesia Type: General Level of consciousness: awake and alert Pain management: pain level controlled Vital Signs Assessment: post-procedure vital signs reviewed and stable Respiratory status: spontaneous breathing, nonlabored ventilation, respiratory function stable and patient connected to nasal cannula oxygen Cardiovascular status: blood pressure returned to baseline and stable Postop Assessment: no apparent nausea or vomiting Anesthetic complications: no    Last Vitals:  Vitals:   03/25/18 1245 03/25/18 1300  BP: 134/81 121/85  Pulse: (!) 59 (!) 58  Resp: 14 13  Temp:  36.9 C  SpO2: 100% 100%    Last Pain:  Vitals:   03/25/18 1300  TempSrc:   PainSc: Asleep                 Rosy Estabrook P Polly Barner

## 2018-03-25 NOTE — Anesthesia Preprocedure Evaluation (Addendum)
Anesthesia Evaluation  Patient identified by MRN, date of birth, ID band Patient awake    Reviewed: Allergy & Precautions, NPO status , Patient's Chart, lab work & pertinent test results  Airway Mallampati: III  TM Distance: >3 FB Neck ROM: Full    Dental no notable dental hx.    Pulmonary    Pulmonary exam normal breath sounds clear to auscultation       Cardiovascular negative cardio ROS Normal cardiovascular exam Rhythm:Regular Rate:Normal     Neuro/Psych  Headaches, negative psych ROS   GI/Hepatic negative GI ROS, Neg liver ROS,   Endo/Other  negative endocrine ROS  Renal/GU negative Renal ROS     Musculoskeletal   Abdominal   Peds  Hematology  (+) anemia ,   Anesthesia Other Findings THYROID NEOPLASM OF UNCERTAIN BEHAVIOR  Reproductive/Obstetrics                            Anesthesia Physical Anesthesia Plan  ASA: II  Anesthesia Plan: General   Post-op Pain Management:    Induction: Intravenous  PONV Risk Score and Plan: 3 and Ondansetron, Dexamethasone, Midazolam and Treatment may vary due to age or medical condition  Airway Management Planned: Oral ETT  Additional Equipment:   Intra-op Plan:   Post-operative Plan: Extubation in OR  Informed Consent: I have reviewed the patients History and Physical, chart, labs and discussed the procedure including the risks, benefits and alternatives for the proposed anesthesia with the patient or authorized representative who has indicated his/her understanding and acceptance.   Dental advisory given  Plan Discussed with: CRNA  Anesthesia Plan Comments:         Anesthesia Quick Evaluation

## 2018-03-25 NOTE — Progress Notes (Signed)
Crying on admission, able to speak without difficulty, coughs and swallows easily

## 2018-03-25 NOTE — Op Note (Signed)
Procedure Note  Pre-operative Diagnosis:  Thyroid neoplasm of uncertain behavior  Post-operative Diagnosis:  same  Surgeon:  Armandina Gemma, MD  Assistant:  Nedra Hai, MD   Procedure:  Right thyroid lobectomy  Anesthesia:  General  Estimated Blood Loss:  minimal  Drains: none         Specimen: thyroid lobe to pathology  Indications:  Patient is referred by Dr. Dagmar Hait for surgical evaluation and management of right thyroid nodule with cytologic atypia. Patient was originally diagnosed with a right-sided thyroid nodule 2 years ago in [place], [place]. She underwent fine-needle aspiration biopsy and was told that she did not require surgery but should continue to have follow-up. Patient has relocated to [place]. She has been evaluated by Dr. Buddy Duty. This included an ultrasound examination which showed a 2.3 cm nodule in the inferior right thyroid lobe. Thyroid gland is otherwise normal in size without any other significant nodules. Patient underwent fine-needle aspiration biopsies which showed cytologic atypia of undetermined significance, Bethesda category III. The sample was sent for molecular genetic testing which revealed a suspicious result, making her risk of malignancy approximately 50%. Patient has had no prior head or neck surgery. She has never been on thyroid medication. There is no significant family history of thyroid disease or other endocrine neoplasm. Patient denies any compressive symptoms. She presents today to discuss right thyroid lobectomy for definitive diagnosis and management.  Procedure Details: Procedure was done in OR #1 at the [facility].  The patient was brought to the operating room and placed in a supine position on the operating room table.  Following administration of general anesthesia, the patient was positioned and then prepped and draped in the usual aseptic fashion.  After ascertaining that an adequate level of anesthesia had been  achieved, a Kocher incision was made with #15 blade.  Dissection was carried through subcutaneous tissues and platysma. Hemostasis was achieved with the electrocautery.  Skin flaps were elevated cephalad and caudad from the thyroid notch to the sternal notch.  A self-retaining retractor was placed for exposure.  Strap muscles were incised in the midline and dissection was begun on the right side.  Strap muscles were reflected laterally.  The right thyroid lobe was mildly enlarged with a dominant nodule at the inferior pole.  The lobe was gently mobilized with blunt dissection.  Superior pole vessels were dissected out and divided individually between small and medium Ligaclips with the Harmonic scalpel.  The thyroid lobe was rolled anteriorly.  Branches of the inferior thyroid artery were divided between small Ligaclips with the Harmonic scalpel.  Inferior venous tributaries were divided between Ligaclips.  Both the superior and inferior parathyroid glands were identified and preserved on their vascular pedicles.  The recurrent laryngeal nerve was identified and preserved along its course.  The ligament of Gwenlyn Found was released with the electrocautery and the gland was mobilized onto the anterior trachea. Isthmus was mobilized across the midline.  There was a moderate sized pyramidal lobe present which was resected with the thyroid isthmus.  The thyroid parenchyma was transected at the junction of the isthmus and contralateral thyroid lobe with the Harmonic scalpel.  The thyroid lobe and isthmus were submitted to pathology for review.  The neck was irrigated with warm saline.  Fibrillar was placed throughout the operative field.  Strap muscles were reapproximated in the midline with interrupted 3-0 Vicryl sutures.  Platysma was closed with interrupted 3-0 Vicryl sutures.  Skin was closed with a running 4-0 Monocryl  subcuticular suture.  Wound was washed and dried and steri-strips were applied.  Dry gauze dressing was  placed.  The patient was awakened from anesthesia and brought to the recovery room.  The patient tolerated the procedure well.   Armandina Gemma, MD [facility] Office: [phone]

## 2018-03-25 NOTE — Anesthesia Procedure Notes (Signed)
Procedure Name: Intubation Date/Time: 03/25/2018 10:26 AM Performed by: Pilar Grammes, CRNA Pre-anesthesia Checklist: Patient identified, Emergency Drugs available, Suction available, Patient being monitored and Timeout performed Patient Re-evaluated:Patient Re-evaluated prior to induction Oxygen Delivery Method: Circle system utilized Preoxygenation: Pre-oxygenation with 100% oxygen Induction Type: IV induction Ventilation: Mask ventilation without difficulty Laryngoscope Size: Miller and 3 Tube type: Oral Tube size: 7.0 mm Number of attempts: 1 Airway Equipment and Method: Stylet Placement Confirmation: positive ETCO2,  ETT inserted through vocal cords under direct vision,  CO2 detector and breath sounds checked- equal and bilateral Secured at: 22 cm Tube secured with: Tape Dental Injury: Teeth and Oropharynx as per pre-operative assessment

## 2018-03-25 NOTE — Transfer of Care (Signed)
Immediate Anesthesia Transfer of Care Note  Patient: Ann Olson  Procedure(s) Performed: RIGHT THYROID LOBECTOMY (Right Neck)  Patient Location: PACU  Anesthesia Type:General  Level of Consciousness: alert , drowsy and confused  Airway & Oxygen Therapy: Patient connected to face mask oxygen  Post-op Assessment: Report given to RN, Post -op Vital signs reviewed and stable and Patient moving all extremities X 4  Post vital signs: stable  Last Vitals:  Vitals Value Taken Time  BP 149/101 03/25/2018 11:50 AM  Temp    Pulse 88 03/25/2018 11:53 AM  Resp 15 03/25/2018 11:53 AM  SpO2 100 % 03/25/2018 11:53 AM  Vitals shown include unvalidated device data.  Last Pain:  Vitals:   03/25/18 0854  TempSrc:   PainSc: 0-No pain         Complications: No apparent anesthesia complications

## 2018-03-25 NOTE — Plan of Care (Signed)
  Problem: Education: Goal: Knowledge of General Education information will improve Outcome: Progressing   

## 2018-03-25 NOTE — Interval H&P Note (Signed)
History and Physical Interval Note:  03/25/2018 9:59 AM  Ann Olson  has presented today for surgery, with the diagnosis of THYROID NEOPLASM OF UNCERTAIN BEHAVIOR  The various methods of treatment have been discussed with the patient and family. After consideration of risks, benefits and other options for treatment, the patient has consented to  Procedure(s): RIGHT THYROID LOBECTOMY (Right) as a surgical intervention .  The patient's history has been reviewed, patient examined, no change in status, stable for surgery.  I have reviewed the patient's chart and labs.  Questions were answered to the patient's satisfaction.     Alyene Predmore Jerilynn Mages

## 2018-03-26 DIAGNOSIS — E041 Nontoxic single thyroid nodule: Secondary | ICD-10-CM | POA: Diagnosis not present

## 2018-03-26 DIAGNOSIS — D440 Neoplasm of uncertain behavior of thyroid gland: Secondary | ICD-10-CM | POA: Diagnosis not present

## 2018-03-26 MED ORDER — TRAMADOL HCL 50 MG PO TABS: 50.0000 mg | ORAL_TABLET | Freq: Four times a day (QID) | ORAL | 0 refills | Status: AC | PRN

## 2018-03-26 NOTE — Discharge Summary (Signed)
    Physician Discharge Summary [facility]  Patient ID: Ann Olson MRN: [id number] DOB/AGE: 40-Feb-1977 40 y.o.  Admit date: 03/25/2018 Discharge date: 03/26/2018  Admission Diagnoses:  Thyroid neoplasm of uncertain behavior  Discharge Diagnoses:  Principal Problem:   Neoplasm of uncertain behavior of thyroid gland   Discharged Condition: good  Hospital Course: Patient was admitted for observation following thyroid surgery.  Post op course was uncomplicated.  Pain was well controlled.  Tolerated diet.  Patient was prepared for discharge home on POD#1.  Consults: None  Treatments: surgery: right thyroid lobectomy  Discharge Exam: Blood pressure 110/69, pulse 65, temperature 98 F (36.7 C), temperature source Oral, resp. rate 17, height 5\' 8"  (1.727 m), weight 75.8 kg (167 lb), last menstrual period 03/13/2018, SpO2 100 %. HEENT - clear Neck - wound dry and intact; steri-strips in place; minimal STS; voice normal Chest - clear bilaterally Cor - RRR  Disposition: Home  Discharge Instructions    Diet - low sodium heart healthy   Complete by:  As directed    Increase activity slowly   Complete by:  As directed    Remove dressing in 24 hours   Complete by:  As directed      Allergies as of 03/26/2018      Reactions   Penicillins Anaphylaxis   Has patient had a PCN reaction causing immediate rash, facial/tongue/throat swelling, SOB or lightheadedness with hypotension: Yes Has patient had a PCN reaction causing severe rash involving mucus membranes or skin necrosis: No Has patient had a PCN reaction that required hospitalization: No Has patient had a PCN reaction occurring within the last 10 years: No If all of the above answers are "NO", then may proceed with Cephalosporin use.   Latex Itching, Swelling      Medication List    TAKE these medications   diphenhydrAMINE 25 MG tablet Commonly known as:  BENADRYL Take 25 mg by mouth daily as needed  for allergies.   elvitegravir-cobicistat-emtricitabine-tenofovir 150-150-200-10 MG Tabs tablet Commonly known as:  GENVOYA Take 1 tablet by mouth daily with breakfast.   elvitegravir-cobicistat-emtricitabine-tenofovir 150-150-200-10 MG Tabs tablet Commonly known as:  GENVOYA Take 1 tablet by mouth daily with breakfast.   Melatonin 3 MG Tabs Take 3 mg by mouth at bedtime as needed (sleep).   traMADol 50 MG tablet Commonly known as:  ULTRAM Take 1-2 tablets (50-100 mg total) by mouth every 6 (six) hours as needed for moderate pain (mild pain not controlled by Tylenol).      Follow-up Information    Armandina Gemma, MD. Schedule an appointment as soon as possible for a visit in 3 week(s).   Specialty:  General Surgery Contact information: [address] [place] [address] [phone]           Earnstine Regal, MD, [facility] Office: [phone]   Signed: Earnstine Regal 03/26/2018, 7:13 AM

## 2018-03-26 NOTE — Discharge Instructions (Signed)
[facility]  THYROID & PARATHYROID SURGERY:  POST-OP INSTRUCTIONS  Always review your discharge instruction sheet from the facility where your surgery was performed.  A prescription for pain medication may be given to you upon discharge.  Take your pain medication as prescribed.  If narcotic pain medicine is not needed, then you may take acetaminophen (Tylenol) or ibuprofen (Advil) as needed.  Take your usually prescribed medications unless otherwise directed.  If you need a refill on your pain medication, please contact our office during regular business hours.  Prescriptions cannot be processed by our office after 5 pm or on weekends.  Start with a light diet upon arrival home, such as soup and crackers or toast.  Be sure to drink plenty of fluids daily.  Resume your normal diet the day after surgery.  Most patients will experience some swelling and bruising on the chest and neck area.  Ice packs will help.  Swelling and bruising can take several days to resolve.   It is common to experience some constipation after surgery.  Increasing fluid intake and taking a stool softener (Colace) will usually help or prevent this problem.  A mild laxative (Milk of Magnesia or Miralax) should be taken according to package directions if there has been no bowel movement after 48 hours.  You have steri-strips and a gauze dressing over your incision.  You may remove the gauze bandage on the second day after surgery, and you may shower at that time.  Leave your steri-strips (small skin tapes) in place directly over the incision.  These strips should remain on the skin for 5-7 days and then be removed.  You may get them wet in the shower and pat them dry.  You may resume regular (light) daily activities beginning the next day (such as daily self-care, walking, climbing stairs) gradually increasing activities as tolerated.  You may have sexual intercourse when it is comfortable.  Refrain from  any heavy lifting or straining until approved by your doctor.  You may drive when you no longer are taking prescription pain medication, you can comfortably wear a seatbelt, and you can safely maneuver your car and apply brakes.  You should see your doctor in the office for a follow-up appointment approximately three weeks after your surgery.  Make sure that you call for this appointment within a day or two after you arrive home to insure a convenient appointment time.  WHEN TO CALL YOUR DOCTOR: -- Fever greater than 101.5 -- Inability to urinate -- Nausea and/or vomiting - persistent -- Extreme swelling or bruising -- Continued bleeding from incision -- Increased pain, redness, or drainage from the incision -- Difficulty swallowing or breathing -- Muscle cramping or spasms -- Numbness or tingling in hands or around lips  The clinic staff is available to answer your questions during regular business hours.  Please don't hesitate to call and ask to speak to one of the nurses if you have concerns.  Norell Brisbin, MD [facility] Office: [phone] 

## 2018-03-29 NOTE — Progress Notes (Signed)
Please contact patient and notify of benign pathology results.  Kyli Sorter M. Azarah Dacy, MD, FACS [facility] Office: [phone]   

## 2018-05-12 DIAGNOSIS — D497 Neoplasm of unspecified behavior of endocrine glands and other parts of nervous system: Secondary | ICD-10-CM | POA: Diagnosis not present

## 2018-11-05 DIAGNOSIS — D497 Neoplasm of unspecified behavior of endocrine glands and other parts of nervous system: Secondary | ICD-10-CM | POA: Diagnosis not present

## 2018-11-11 DIAGNOSIS — D497 Neoplasm of unspecified behavior of endocrine glands and other parts of nervous system: Secondary | ICD-10-CM | POA: Diagnosis not present

## 2018-11-11 DIAGNOSIS — R946 Abnormal results of thyroid function studies: Secondary | ICD-10-CM | POA: Diagnosis not present

## 2018-12-09 DIAGNOSIS — E041 Nontoxic single thyroid nodule: Secondary | ICD-10-CM | POA: Diagnosis not present

## 2019-01-06 DIAGNOSIS — Z113 Encounter for screening for infections with a predominantly sexual mode of transmission: Secondary | ICD-10-CM | POA: Diagnosis not present

## 2019-01-06 DIAGNOSIS — Z01419 Encounter for gynecological examination (general) (routine) without abnormal findings: Secondary | ICD-10-CM | POA: Diagnosis not present

## 2019-02-15 DIAGNOSIS — R110 Nausea: Secondary | ICD-10-CM | POA: Diagnosis not present

## 2019-02-15 DIAGNOSIS — K219 Gastro-esophageal reflux disease without esophagitis: Secondary | ICD-10-CM | POA: Diagnosis not present

## 2019-12-27 DIAGNOSIS — R208 Other disturbances of skin sensation: Secondary | ICD-10-CM | POA: Diagnosis not present

## 2019-12-27 DIAGNOSIS — L910 Hypertrophic scar: Secondary | ICD-10-CM | POA: Diagnosis not present

## 2019-12-27 DIAGNOSIS — D171 Benign lipomatous neoplasm of skin and subcutaneous tissue of trunk: Secondary | ICD-10-CM | POA: Diagnosis not present

## 2020-01-11 DIAGNOSIS — R222 Localized swelling, mass and lump, trunk: Secondary | ICD-10-CM | POA: Diagnosis not present

## 2020-01-17 DIAGNOSIS — M79674 Pain in right toe(s): Secondary | ICD-10-CM | POA: Diagnosis not present

## 2020-01-17 DIAGNOSIS — W228XXA Striking against or struck by other objects, initial encounter: Secondary | ICD-10-CM | POA: Diagnosis not present

## 2020-01-17 DIAGNOSIS — Z880 Allergy status to penicillin: Secondary | ICD-10-CM | POA: Diagnosis not present

## 2020-01-17 DIAGNOSIS — Z91040 Latex allergy status: Secondary | ICD-10-CM | POA: Diagnosis not present

## 2020-01-17 DIAGNOSIS — R200 Anesthesia of skin: Secondary | ICD-10-CM | POA: Diagnosis not present

## 2020-01-17 DIAGNOSIS — S90111A Contusion of right great toe without damage to nail, initial encounter: Secondary | ICD-10-CM | POA: Diagnosis not present

## 2020-02-09 DIAGNOSIS — Z1159 Encounter for screening for other viral diseases: Secondary | ICD-10-CM | POA: Diagnosis not present

## 2020-02-16 DIAGNOSIS — D171 Benign lipomatous neoplasm of skin and subcutaneous tissue of trunk: Secondary | ICD-10-CM | POA: Diagnosis not present

## 2020-02-16 DIAGNOSIS — E079 Disorder of thyroid, unspecified: Secondary | ICD-10-CM | POA: Diagnosis not present

## 2020-03-12 DIAGNOSIS — Z7189 Other specified counseling: Secondary | ICD-10-CM | POA: Diagnosis not present

## 2020-03-12 DIAGNOSIS — D497 Neoplasm of unspecified behavior of endocrine glands and other parts of nervous system: Secondary | ICD-10-CM | POA: Diagnosis not present

## 2020-03-12 DIAGNOSIS — R946 Abnormal results of thyroid function studies: Secondary | ICD-10-CM | POA: Diagnosis not present

## 2020-03-13 DIAGNOSIS — Z23 Encounter for immunization: Secondary | ICD-10-CM | POA: Diagnosis not present

## 2020-04-03 DIAGNOSIS — Z23 Encounter for immunization: Secondary | ICD-10-CM | POA: Diagnosis not present

## 2020-05-14 DIAGNOSIS — R946 Abnormal results of thyroid function studies: Secondary | ICD-10-CM | POA: Diagnosis not present

## 2020-07-17 DIAGNOSIS — Z01419 Encounter for gynecological examination (general) (routine) without abnormal findings: Secondary | ICD-10-CM | POA: Diagnosis not present

## 2020-07-17 DIAGNOSIS — Z1239 Encounter for other screening for malignant neoplasm of breast: Secondary | ICD-10-CM | POA: Diagnosis not present

## 2020-07-17 DIAGNOSIS — N898 Other specified noninflammatory disorders of vagina: Secondary | ICD-10-CM | POA: Diagnosis not present

## 2020-07-17 DIAGNOSIS — R946 Abnormal results of thyroid function studies: Secondary | ICD-10-CM | POA: Diagnosis not present

## 2020-07-19 ENCOUNTER — Other Ambulatory Visit: Payer: Self-pay | Admitting: Nurse Practitioner

## 2020-07-19 DIAGNOSIS — Z1231 Encounter for screening mammogram for malignant neoplasm of breast: Secondary | ICD-10-CM

## 2020-08-10 ENCOUNTER — Other Ambulatory Visit: Payer: Self-pay

## 2020-08-10 ENCOUNTER — Ambulatory Visit
Admission: RE | Admit: 2020-08-10 | Discharge: 2020-08-10 | Disposition: A | Discharge status: Home / Self Care | Payer: BC Managed Care – PPO | Source: Ambulatory Visit | Attending: Nurse Practitioner | Admitting: Nurse Practitioner

## 2020-08-10 DIAGNOSIS — Z1231 Encounter for screening mammogram for malignant neoplasm of breast: Secondary | ICD-10-CM

## 2020-11-11 IMAGING — MG DIGITAL SCREENING BILAT W/ TOMO W/ CAD
8 series · 8 of 24 positions shown · non-contrast
Comparison: None.

CLINICAL DATA: Screening.

EXAM:
DIGITAL SCREENING BILATERAL MAMMOGRAM WITH TOMO AND CAD

[L MLO synth-2D]
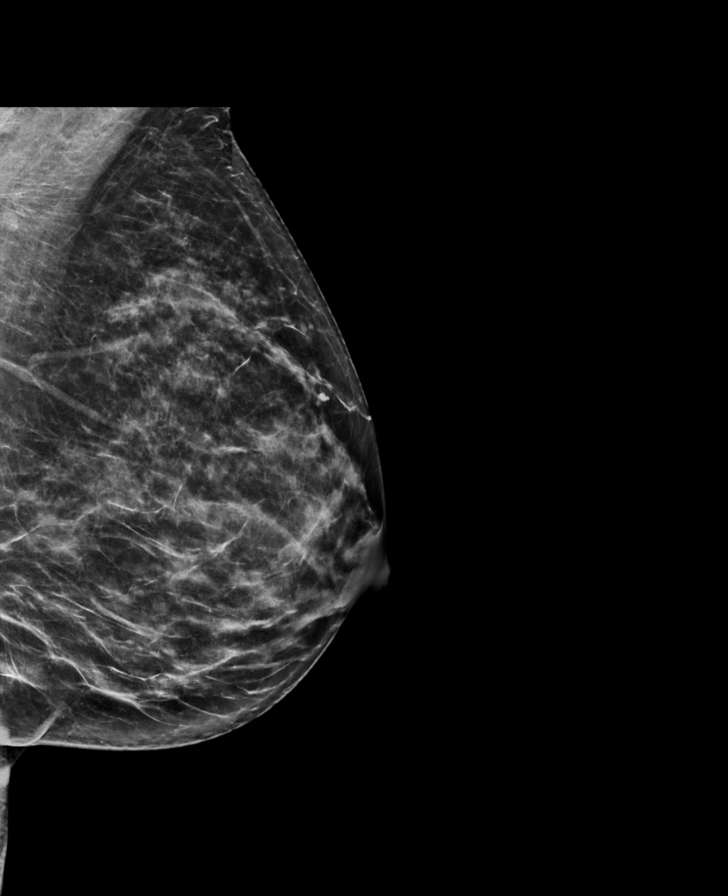

[L CC synth-2D]
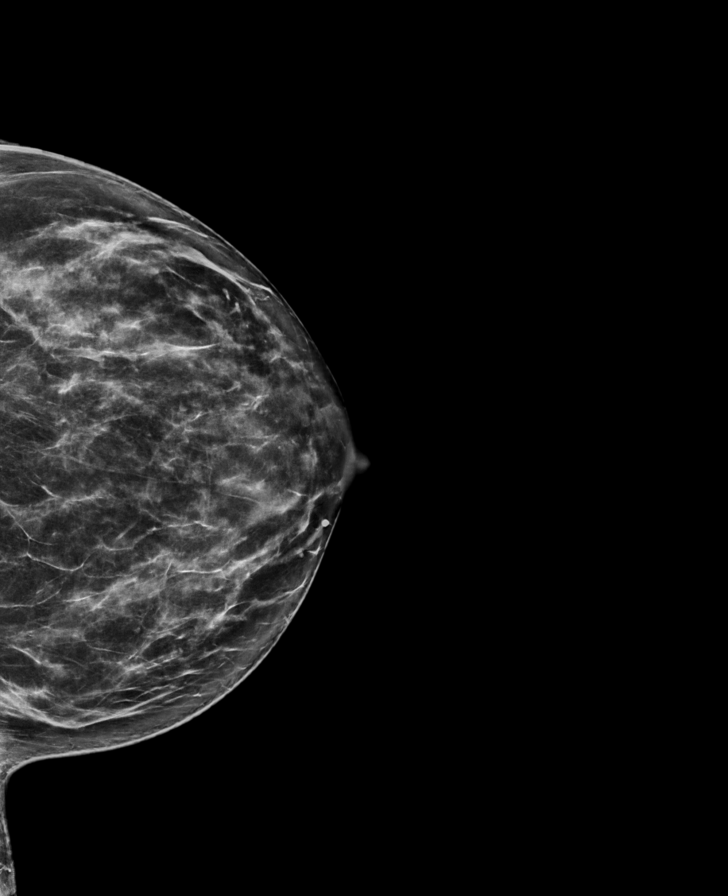

[R CC synth-2D]
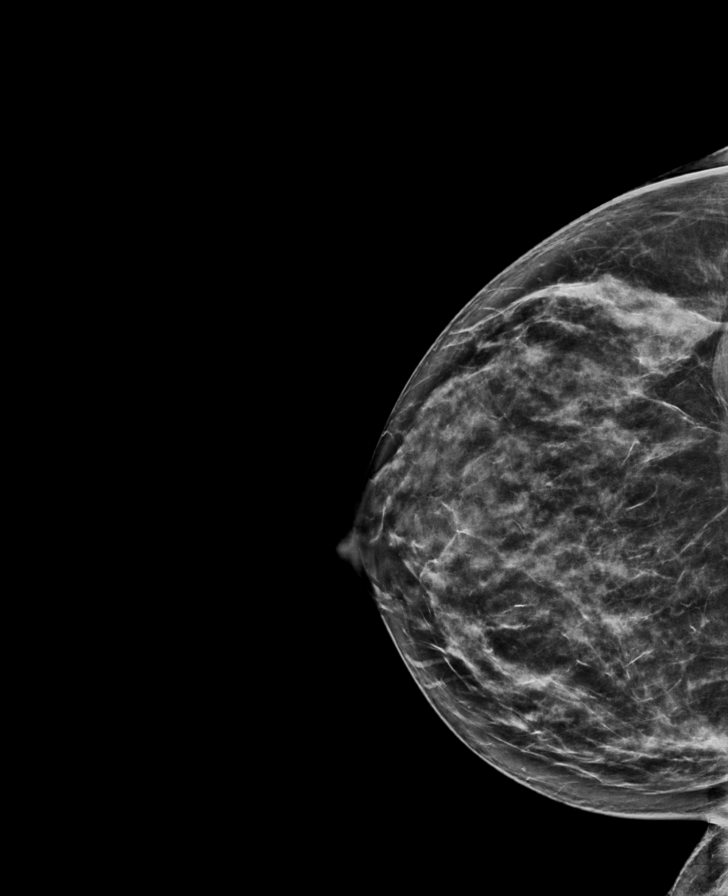

[R MLO synth-2D]
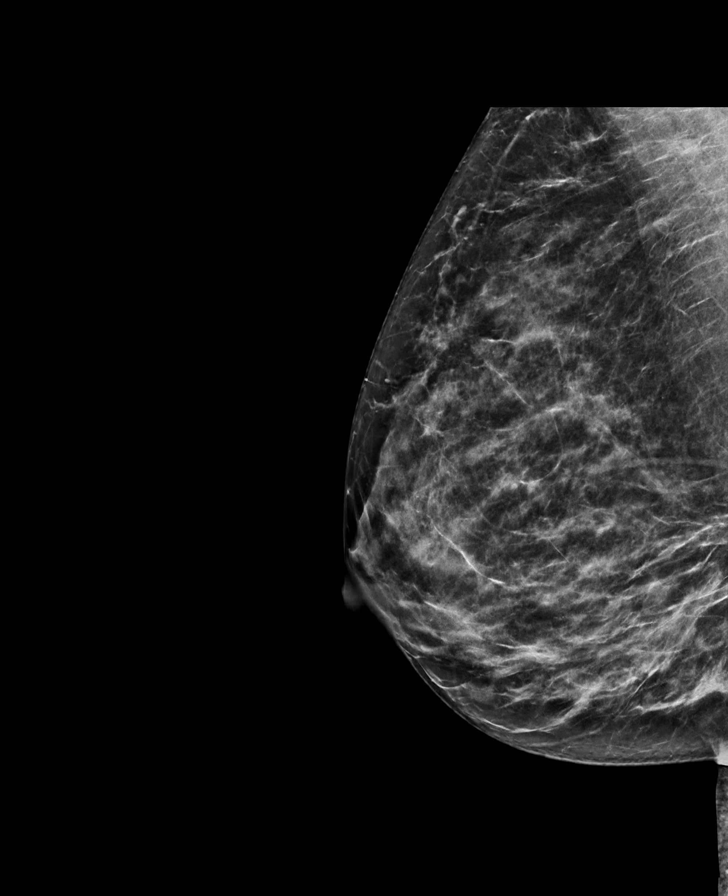

[R MLO tomo · tomo slice 36/71.0]
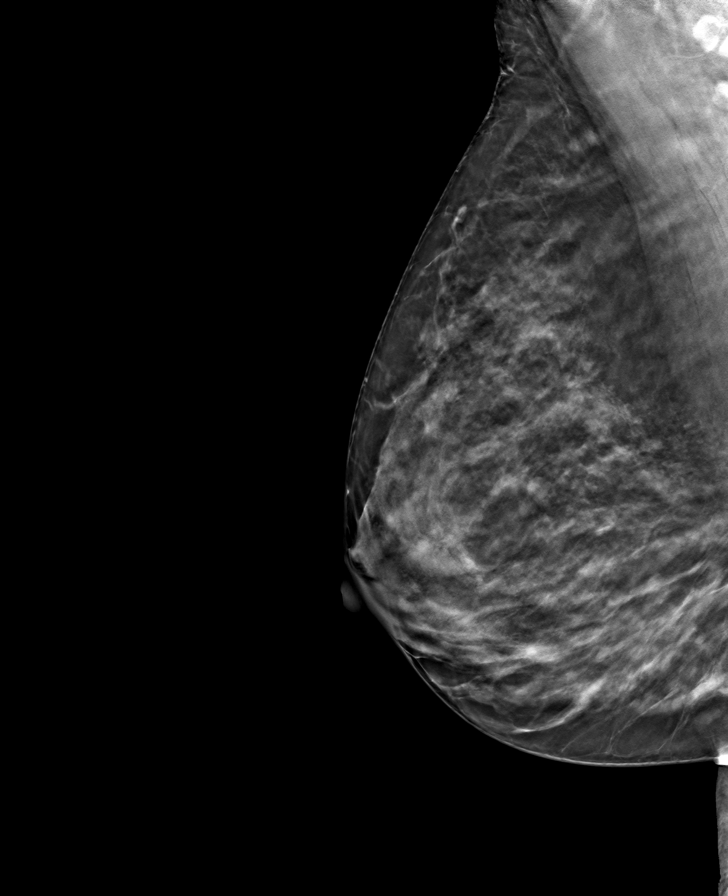

[L CC tomo · tomo slice 38/75.0]
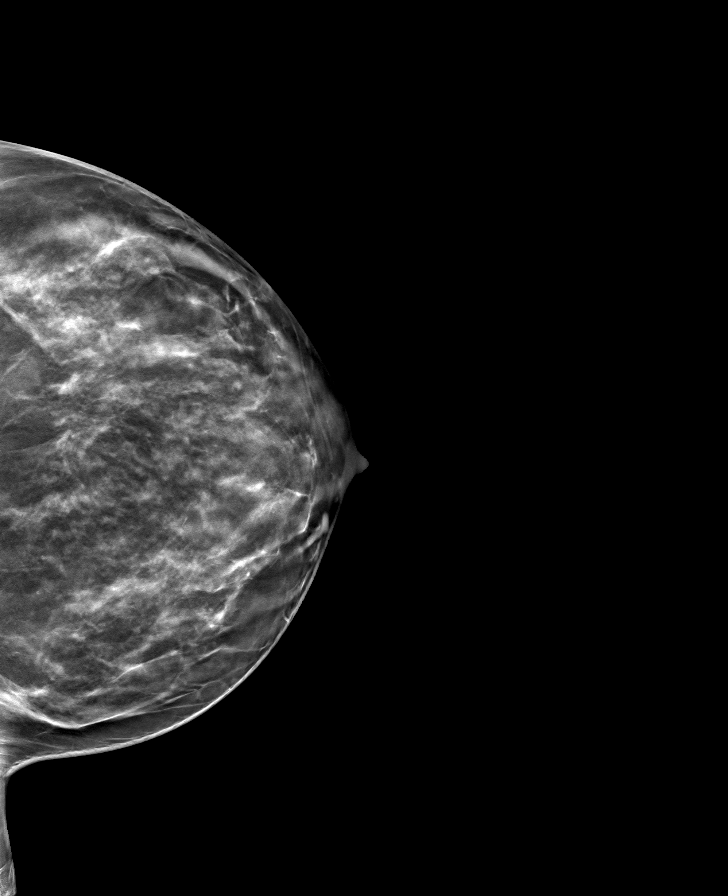

[L MLO tomo · tomo slice 35/69.0]
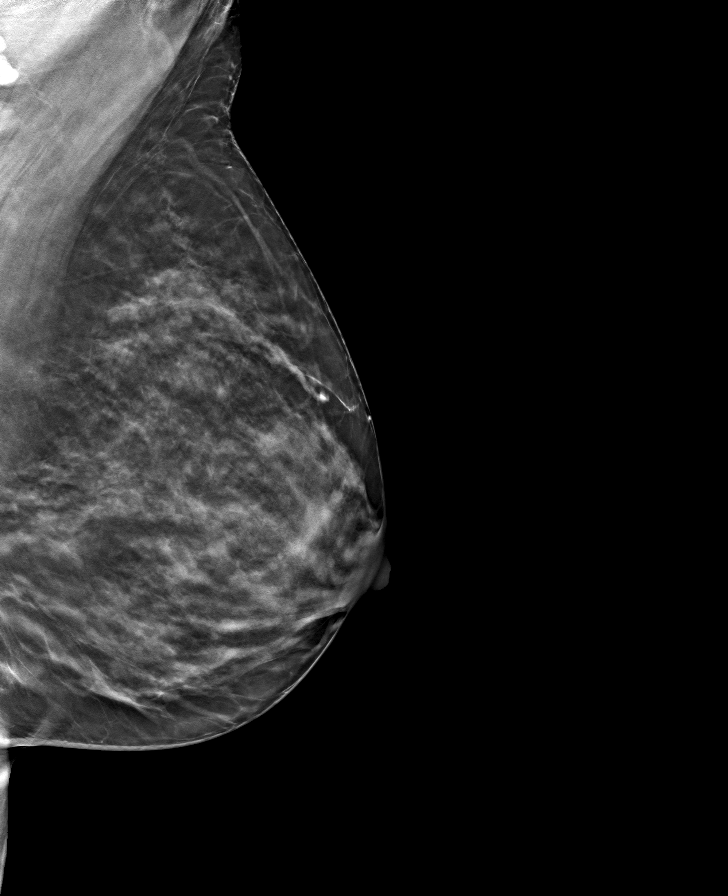

[R CC tomo · tomo slice 39/76.0]
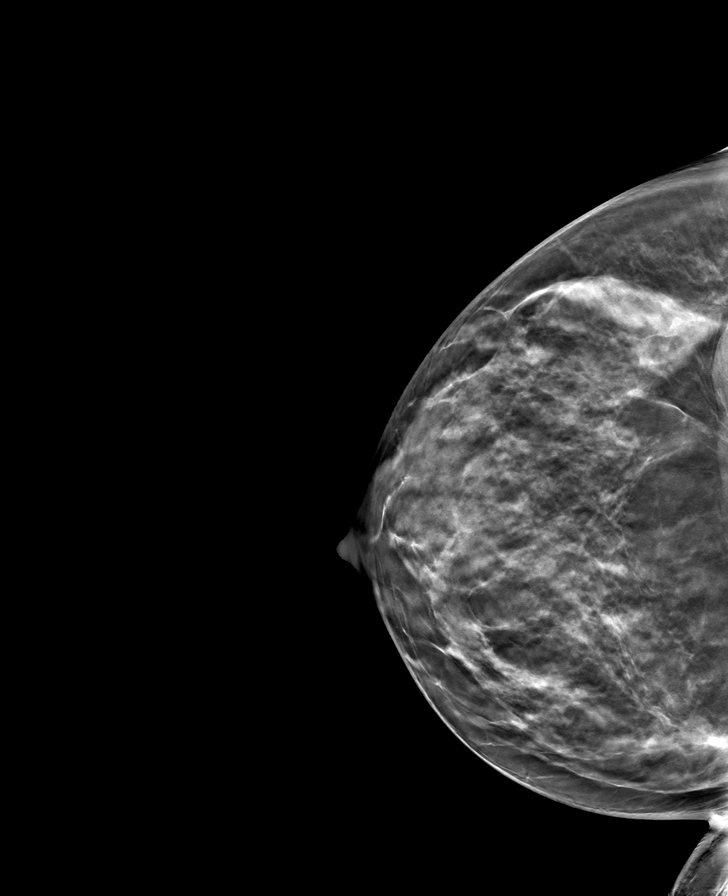

[8 of 24 positions shown; findings below may reference images not displayed]

ACR Breast Density Category c: The breast tissue is heterogeneously
dense, which may obscure small masses
FINDINGS: There are no findings suspicious for malignancy. Images were
processed with CAD.
IMPRESSION: No mammographic evidence of malignancy. A result letter of this
screening mammogram will be mailed directly to the patient.

RECOMMENDATION:
Screening mammogram in one year. (Code:EM-[id number])

BI-RADS CATEGORY  1: Negative.

## 2020-12-07 DIAGNOSIS — R0602 Shortness of breath: Secondary | ICD-10-CM | POA: Diagnosis not present

## 2020-12-07 DIAGNOSIS — M7989 Other specified soft tissue disorders: Secondary | ICD-10-CM | POA: Diagnosis not present

## 2020-12-07 DIAGNOSIS — D649 Anemia, unspecified: Secondary | ICD-10-CM | POA: Diagnosis not present

## 2020-12-07 DIAGNOSIS — D75839 Thrombocytosis, unspecified: Secondary | ICD-10-CM | POA: Diagnosis not present

## 2020-12-07 DIAGNOSIS — Z79890 Hormone replacement therapy (postmenopausal): Secondary | ICD-10-CM | POA: Diagnosis not present

## 2020-12-07 DIAGNOSIS — R5383 Other fatigue: Secondary | ICD-10-CM | POA: Diagnosis not present

## 2020-12-07 DIAGNOSIS — Z91040 Latex allergy status: Secondary | ICD-10-CM | POA: Diagnosis not present

## 2020-12-07 DIAGNOSIS — M542 Cervicalgia: Secondary | ICD-10-CM | POA: Diagnosis not present

## 2020-12-07 DIAGNOSIS — R079 Chest pain, unspecified: Secondary | ICD-10-CM | POA: Diagnosis not present

## 2020-12-07 DIAGNOSIS — E079 Disorder of thyroid, unspecified: Secondary | ICD-10-CM | POA: Diagnosis not present

## 2020-12-07 DIAGNOSIS — Z880 Allergy status to penicillin: Secondary | ICD-10-CM | POA: Diagnosis not present

## 2020-12-07 DIAGNOSIS — R0789 Other chest pain: Secondary | ICD-10-CM | POA: Diagnosis not present

## 2020-12-07 DIAGNOSIS — R221 Localized swelling, mass and lump, neck: Secondary | ICD-10-CM | POA: Diagnosis not present

## 2021-03-26 DIAGNOSIS — D497 Neoplasm of unspecified behavior of endocrine glands and other parts of nervous system: Secondary | ICD-10-CM | POA: Diagnosis not present

## 2021-03-26 DIAGNOSIS — D509 Iron deficiency anemia, unspecified: Secondary | ICD-10-CM | POA: Diagnosis not present

## 2021-03-26 DIAGNOSIS — R109 Unspecified abdominal pain: Secondary | ICD-10-CM | POA: Diagnosis not present

## 2021-03-26 DIAGNOSIS — R140 Abdominal distension (gaseous): Secondary | ICD-10-CM | POA: Diagnosis not present

## 2021-03-26 DIAGNOSIS — D473 Essential (hemorrhagic) thrombocythemia: Secondary | ICD-10-CM | POA: Diagnosis not present

## 2021-03-26 DIAGNOSIS — R946 Abnormal results of thyroid function studies: Secondary | ICD-10-CM | POA: Diagnosis not present

## 2021-10-01 DIAGNOSIS — D2261 Melanocytic nevi of right upper limb, including shoulder: Secondary | ICD-10-CM | POA: Diagnosis not present

## 2021-10-01 DIAGNOSIS — D225 Melanocytic nevi of trunk: Secondary | ICD-10-CM | POA: Diagnosis not present

## 2021-10-01 DIAGNOSIS — L905 Scar conditions and fibrosis of skin: Secondary | ICD-10-CM | POA: Diagnosis not present

## 2021-10-01 DIAGNOSIS — D2262 Melanocytic nevi of left upper limb, including shoulder: Secondary | ICD-10-CM | POA: Diagnosis not present

## 2021-10-01 DIAGNOSIS — D2272 Melanocytic nevi of left lower limb, including hip: Secondary | ICD-10-CM | POA: Diagnosis not present

## 2021-11-08 DIAGNOSIS — R208 Other disturbances of skin sensation: Secondary | ICD-10-CM | POA: Diagnosis not present

## 2021-11-08 DIAGNOSIS — L910 Hypertrophic scar: Secondary | ICD-10-CM | POA: Diagnosis not present

## 2021-11-15 DIAGNOSIS — D1721 Benign lipomatous neoplasm of skin and subcutaneous tissue of right arm: Secondary | ICD-10-CM | POA: Diagnosis not present

## 2021-12-23 DIAGNOSIS — R946 Abnormal results of thyroid function studies: Secondary | ICD-10-CM | POA: Diagnosis not present

## 2021-12-30 DIAGNOSIS — L910 Hypertrophic scar: Secondary | ICD-10-CM | POA: Diagnosis not present

## 2021-12-30 DIAGNOSIS — Z419 Encounter for procedure for purposes other than remedying health state, unspecified: Secondary | ICD-10-CM | POA: Diagnosis not present

## 2022-01-15 DIAGNOSIS — N920 Excessive and frequent menstruation with regular cycle: Secondary | ICD-10-CM | POA: Diagnosis not present

## 2022-01-15 DIAGNOSIS — Z86018 Personal history of other benign neoplasm: Secondary | ICD-10-CM | POA: Diagnosis not present

## 2022-01-15 DIAGNOSIS — N852 Hypertrophy of uterus: Secondary | ICD-10-CM | POA: Diagnosis not present

## 2022-02-03 DIAGNOSIS — N852 Hypertrophy of uterus: Secondary | ICD-10-CM | POA: Diagnosis not present

## 2022-02-03 DIAGNOSIS — D259 Leiomyoma of uterus, unspecified: Secondary | ICD-10-CM | POA: Diagnosis not present

## 2022-02-03 DIAGNOSIS — N920 Excessive and frequent menstruation with regular cycle: Secondary | ICD-10-CM | POA: Diagnosis not present

## 2022-04-14 ENCOUNTER — Other Ambulatory Visit: Payer: Self-pay | Admitting: Nurse Practitioner

## 2022-04-14 DIAGNOSIS — Z1231 Encounter for screening mammogram for malignant neoplasm of breast: Secondary | ICD-10-CM

## 2022-04-29 ENCOUNTER — Other Ambulatory Visit: Payer: Self-pay | Admitting: Nurse Practitioner

## 2022-04-29 DIAGNOSIS — N63 Unspecified lump in unspecified breast: Secondary | ICD-10-CM
